# Patient Record
Sex: Female | Born: 1945 | Race: White | Hispanic: No | Marital: Married
Health system: Southern US, Community
[De-identification: ages and names within clinical notes are randomized; demographics above are authoritative.]

---

## 1998-07-11 ENCOUNTER — Other Ambulatory Visit: Admission: RE | Admit: 1998-07-11 | Discharge: 1998-07-11 | Payer: Self-pay | Admitting: Family Medicine

## 2000-09-15 ENCOUNTER — Encounter: Admission: RE | Admit: 2000-09-15 | Discharge: 2000-09-15 | Payer: Self-pay | Admitting: Family Medicine

## 2001-05-14 ENCOUNTER — Inpatient Hospital Stay (HOSPITAL_COMMUNITY): Admission: EM | Admit: 2001-05-14 | Discharge: 2001-05-15 | Payer: Self-pay | Admitting: Emergency Medicine

## 2001-11-28 ENCOUNTER — Encounter: Admission: RE | Admit: 2001-11-28 | Discharge: 2001-11-28 | Payer: Self-pay | Admitting: Family Medicine

## 2001-12-02 ENCOUNTER — Encounter: Admission: RE | Admit: 2001-12-02 | Discharge: 2001-12-02 | Payer: Self-pay | Admitting: Family Medicine

## 2002-01-16 ENCOUNTER — Ambulatory Visit (HOSPITAL_COMMUNITY): Admission: RE | Admit: 2002-01-16 | Discharge: 2002-01-16 | Payer: Self-pay | Admitting: Family Medicine

## 2002-07-03 ENCOUNTER — Ambulatory Visit (HOSPITAL_COMMUNITY): Admission: RE | Admit: 2002-07-03 | Discharge: 2002-07-03 | Payer: Self-pay

## 2004-11-03 ENCOUNTER — Other Ambulatory Visit: Admission: RE | Admit: 2004-11-03 | Discharge: 2004-11-03 | Payer: Self-pay | Admitting: Family Medicine

## 2011-07-07 ENCOUNTER — Ambulatory Visit
Admission: RE | Admit: 2011-07-07 | Discharge: 2011-07-07 | Disposition: A | Discharge status: Home / Self Care | Payer: BC Managed Care – PPO | Source: Ambulatory Visit | Attending: Family Medicine | Admitting: Family Medicine

## 2011-07-07 ENCOUNTER — Other Ambulatory Visit: Payer: Self-pay | Admitting: Family Medicine

## 2011-07-07 DIAGNOSIS — E049 Nontoxic goiter, unspecified: Secondary | ICD-10-CM

## 2012-01-25 ENCOUNTER — Other Ambulatory Visit: Payer: Self-pay | Admitting: Dermatology

## 2013-11-20 ENCOUNTER — Other Ambulatory Visit (HOSPITAL_COMMUNITY)
Admission: RE | Admit: 2013-11-20 | Discharge: 2013-11-20 | Disposition: A | Discharge status: Home / Self Care | Payer: Medicare Other | Source: Ambulatory Visit | Attending: Family Medicine | Admitting: Family Medicine

## 2013-11-20 ENCOUNTER — Other Ambulatory Visit: Payer: Self-pay | Admitting: Family Medicine

## 2013-11-20 DIAGNOSIS — Z124 Encounter for screening for malignant neoplasm of cervix: Secondary | ICD-10-CM | POA: Insufficient documentation

## 2013-11-20 DIAGNOSIS — Z1151 Encounter for screening for human papillomavirus (HPV): Secondary | ICD-10-CM | POA: Insufficient documentation

## 2015-06-03 ENCOUNTER — Other Ambulatory Visit: Payer: Self-pay | Admitting: Family Medicine

## 2015-06-03 DIAGNOSIS — R748 Abnormal levels of other serum enzymes: Secondary | ICD-10-CM

## 2015-06-12 ENCOUNTER — Other Ambulatory Visit: Payer: BC Managed Care – PPO

## 2015-06-13 ENCOUNTER — Ambulatory Visit
Admission: RE | Admit: 2015-06-13 | Discharge: 2015-06-13 | Disposition: A | Discharge status: Home / Self Care | Payer: BC Managed Care – PPO | Source: Ambulatory Visit | Attending: Family Medicine | Admitting: Family Medicine

## 2015-06-13 DIAGNOSIS — R748 Abnormal levels of other serum enzymes: Secondary | ICD-10-CM

## 2015-06-14 ENCOUNTER — Other Ambulatory Visit: Payer: Self-pay | Admitting: Family Medicine

## 2015-06-14 DIAGNOSIS — K769 Liver disease, unspecified: Secondary | ICD-10-CM

## 2015-07-09 ENCOUNTER — Ambulatory Visit
Admission: RE | Admit: 2015-07-09 | Discharge: 2015-07-09 | Disposition: A | Discharge status: Home / Self Care | Payer: Medicare Other | Source: Ambulatory Visit | Attending: Family Medicine | Admitting: Family Medicine

## 2015-07-09 DIAGNOSIS — K769 Liver disease, unspecified: Secondary | ICD-10-CM

## 2015-07-09 MED ORDER — GADOBENATE DIMEGLUMINE 529 MG/ML IV SOLN
14.0000 mL | Freq: Once | INTRAVENOUS | Status: AC | PRN
  Administered 2015-07-09: 14 mL via INTRAVENOUS

## 2017-03-16 IMAGING — MR MR ABDOMEN WO/W CM
17 series · 48 of 48 positions shown · IV contrast (multihance)
Comparison: 06/13/2015

CLINICAL DATA: Elevated liver function tests. Abnormal liver
lesions on ultrasound of 06/13/2015.

EXAM:
MRI ABDOMEN WITHOUT AND WITH CONTRAST
TECHNIQUE: Multiplanar multisequence MR imaging of the abdomen was performed
both before and after the administration of intravenous contrast.
CONTRAST:  14mL MULTIHANCE GADOBENATE DIMEGLUMINE 529 MG/ML IV SOLN
Creatinine was obtained on site at [HOSPITAL] at [HOSPITAL].
Results: Creatinine 0.7 mg/dL.

[Series 4: T2 · coronal · 5.0mm · 1.56mm/px · 1 of 36 slices shown (1 of 3)]
[im 1/36]
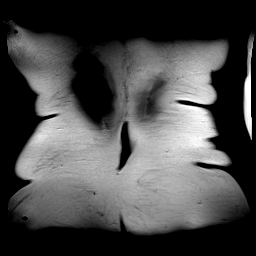

[Series 5: T1 · axial · 3.0mm · 1.19mm/px · z∈[+10,+223]mm · 4 of 144 slices shown]
[im 1/144]
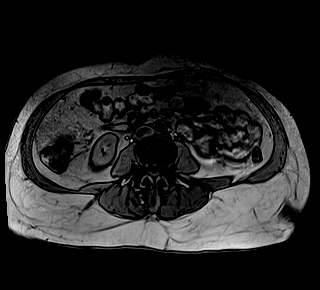
[im 48/144]
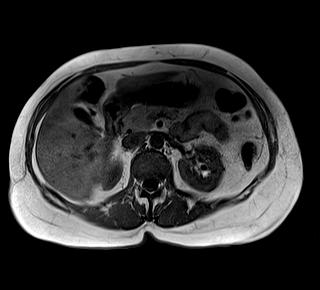
[im 96/144]
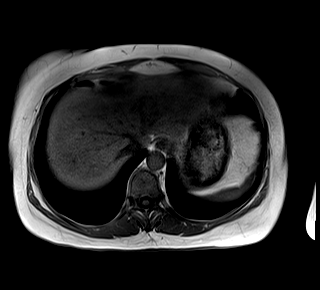
[im 144/144]
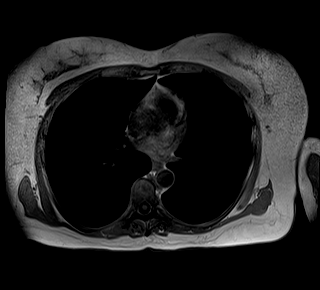

[Series 6: T2 · axial · 5.0mm · 1.48mm/px · 1 of 38 slices shown (2 of 3)]
[im 1/38]
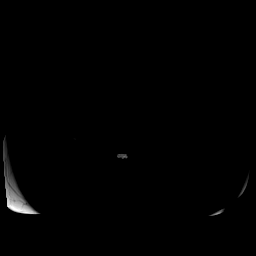

[Series 7: DWI · axial · 5.0mm · 1.42mm/px · z∈[+29,+239]mm · 3 of 108 slices shown (1 of 2)]
[im 1/108]
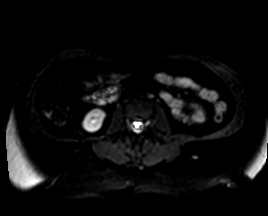
[im 54/108]
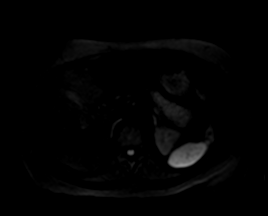
[im 108/108]
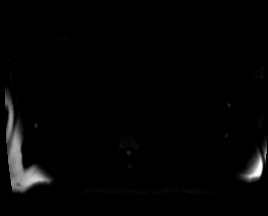

[Series 8: DWI · axial · 5.0mm · 1.42mm/px · 1 of 36 slices shown (2 of 2)]
[im 1/36]
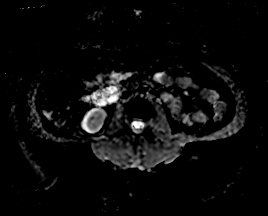

[Series 9: T2 · axial · 6.0mm · 1.22mm/px · 1 of 30 slices shown (3 of 3)]
[im 1/30]
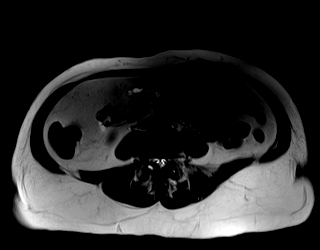

[Series 10: bSSFP · axial · 5.0mm · 1.25mm/px · 1 of 38 slices shown]
[im 1/38]
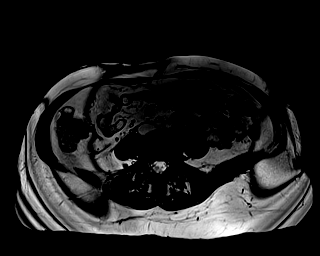

[Series 11: T1 dynamic · axial · non-contrast · 3.0mm · 1.25mm/px · z∈[-8,+205]mm · 2 of 72 slices shown]
[im 1/72]
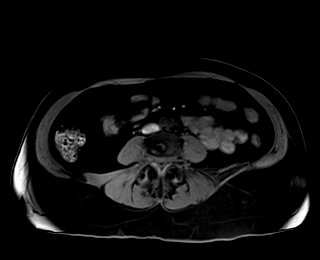
[im 72/72]
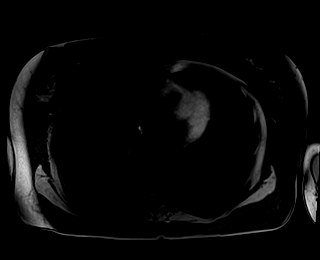

[Series 12: T1 dynamic post-contrast · axial · 3.0mm · 1.25mm/px · z∈[-8,+205]mm · 3 of 72 slices shown (1 of 9)]
[im 1/72]
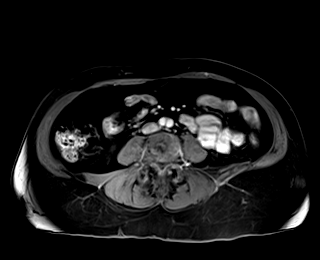
[im 36/72]
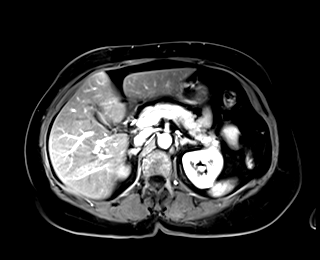
[im 72/72]
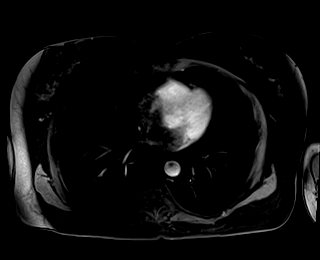

[Series 13: T1 dynamic post-contrast · axial · 3.0mm · 1.25mm/px · z∈[-8,+205]mm · 3 of 72 slices shown (2 of 9)]
[im 1/72]
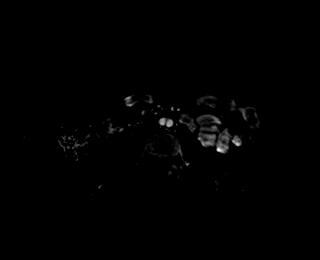
[im 36/72]
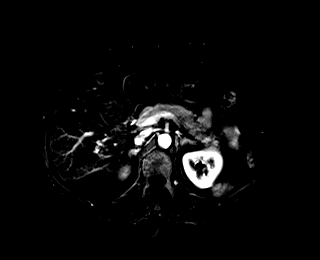
[im 72/72]
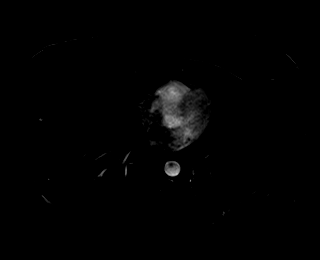

[Series 14: T1 dynamic post-contrast · axial · 3.0mm · 1.25mm/px · z∈[-8,+205]mm · 3 of 72 slices shown (3 of 9)]
[im 1/72]
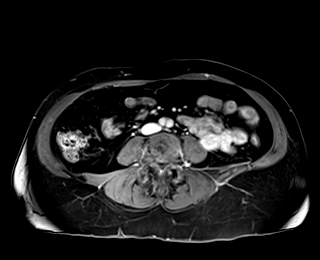
[im 36/72]
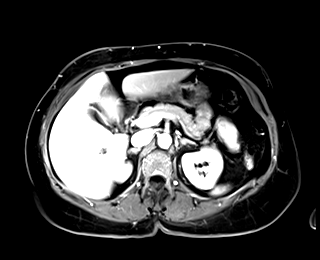
[im 72/72]
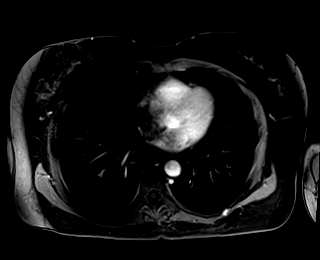

[Series 15: T1 dynamic post-contrast · axial · 3.0mm · 1.25mm/px · z∈[-8,+205]mm · 3 of 72 slices shown (4 of 9)]
[im 1/72]
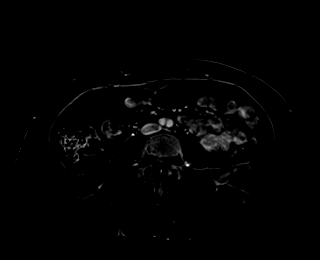
[im 36/72]
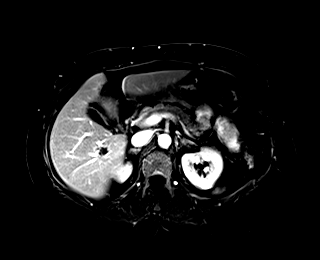
[im 72/72]
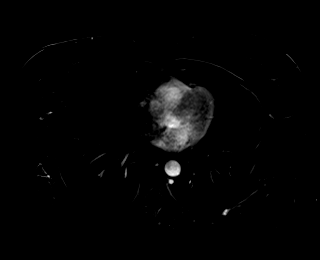

[Series 16: T1 dynamic post-contrast · axial · 3.0mm · 1.25mm/px · z∈[-8,+205]mm · 3 of 72 slices shown (5 of 9)]
[im 1/72]
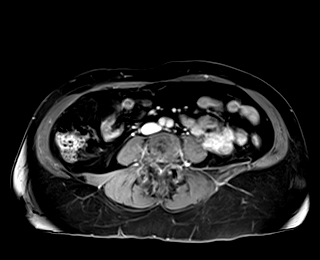
[im 36/72]
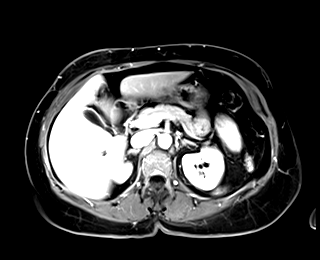
[im 72/72]
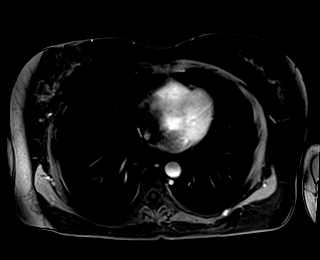

[Series 17: T1 dynamic post-contrast · axial · 3.0mm · 1.25mm/px · z∈[-8,+205]mm · 3 of 72 slices shown (6 of 9)]
[im 1/72]
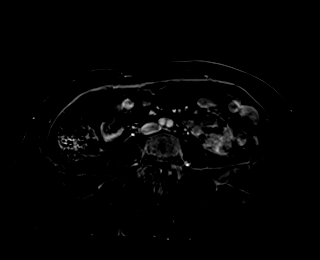
[im 36/72]
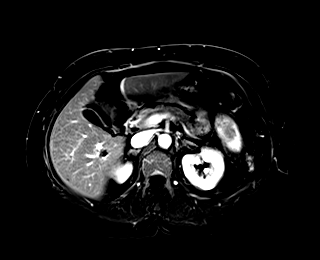
[im 72/72]
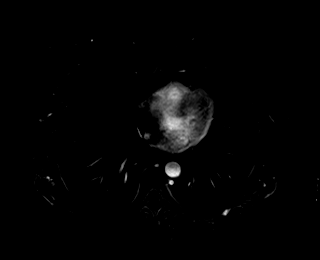

[Series 18: T1 dynamic post-contrast · coronal · 1.5mm · 1.38mm/px · 10 of 288 slices shown (7 of 9)]
[im 1/288]
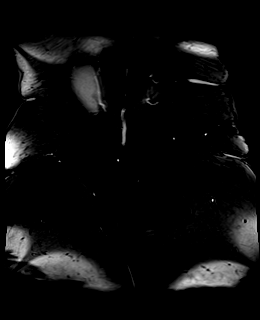
[im 32/288]
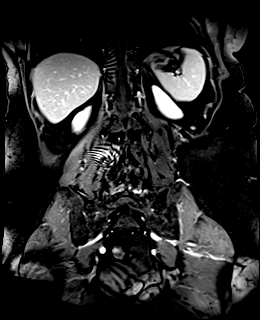
[im 64/288]
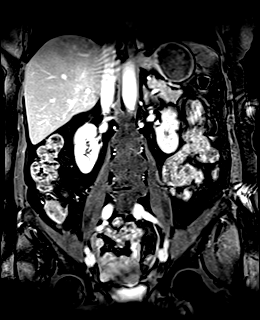
[im 96/288]
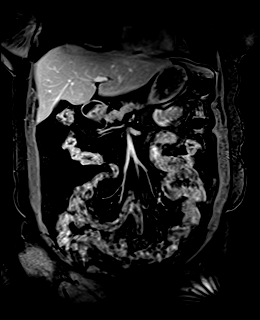
[im 128/288]
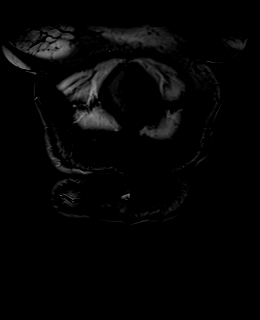
[im 160/288]
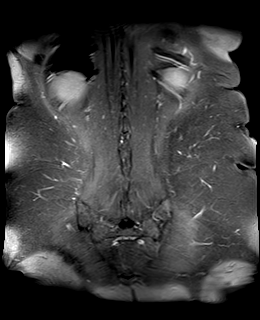
[im 192/288]
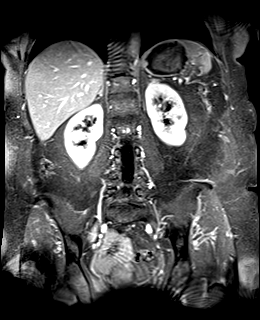
[im 224/288]
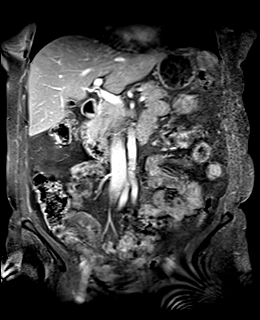
[im 256/288]
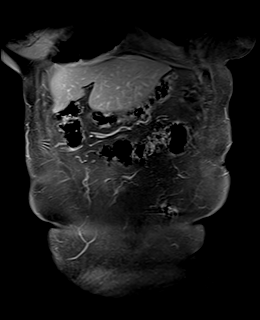
[im 288/288]
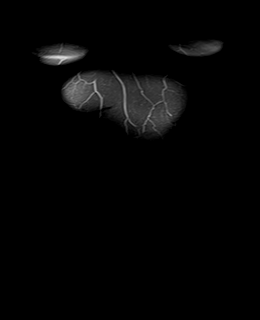

[Series 19: T1 dynamic post-contrast · axial · 3.0mm · 1.25mm/px · z∈[-8,+205]mm · 3 of 72 slices shown (8 of 9)]
[im 1/72]
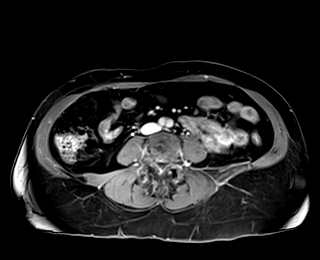
[im 36/72]
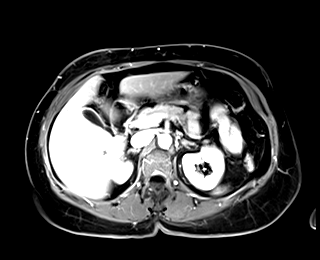
[im 72/72]
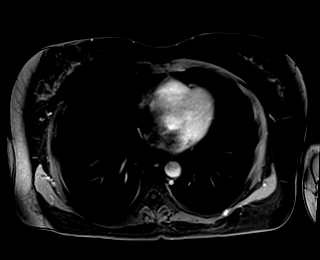

[Series 20: T1 dynamic post-contrast · axial · 3.0mm · 1.25mm/px · z∈[-8,+205]mm · 3 of 72 slices shown (9 of 9)]
[im 1/72]
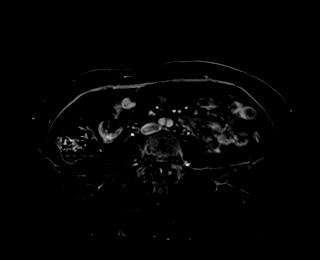
[im 36/72]
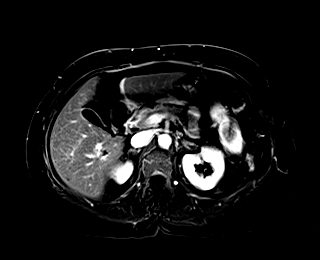
[im 72/72]
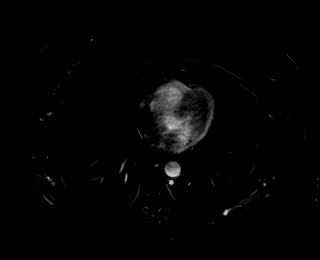

[48 of 48 positions shown; findings below may reference images not displayed]

FINDINGS: Lower chest:  Unremarkable

Hepatobiliary: Focal steatosis in segment 4b of the liver, image 38
series 5.

In segment 6 of the liver, a somewhat bilobed 2.0 by 1.6 cm lesion
has very high T2 signal characteristics and demonstrates
discontinuous nodular progressive centripetal enhancement. This is
not completely filled and by the 3 minutes images but the appearance
is characteristic of a hemangioma.

No other liver lesions are identified. Gallbladder unremarkable. No
biliary dilatation observed.

Pancreas: Unremarkable

Spleen: Unremarkable

Adrenals/Urinary Tract: Unremarkable

Stomach/Bowel: Unremarkable

Vascular/Lymphatic: Unremarkable

Other: No supplemental non-categorized findings.

Musculoskeletal: Degenerative disc disease at L4-5 and L5-S1 with
mild degenerative endplate findings at L4-5.
IMPRESSION: 1. The lesion in segment 4b is felt to represent focal hepatic
steatosis given focal signal dropout on out of phase images in this
vicinity. This appears to correlate with the lesion closest to the
gallbladder fossa on ultrasound.
2. The lesion in segment 6 appears to correlate with the other
sonographic lesion and has enhancement characteristics typical for
benign hemangioma.
3. Lower lumbar degenerative disc disease.

## 2019-08-05 ENCOUNTER — Ambulatory Visit: Payer: Medicare Other | Attending: Internal Medicine

## 2019-08-05 DIAGNOSIS — Z23 Encounter for immunization: Secondary | ICD-10-CM

## 2019-08-05 NOTE — Progress Notes (Signed)
   Covid-19 Vaccination Clinic  Name:  Casey Padilla    MRN: [id number] DOB: January 14, 1946  08/05/2019  Ms. [name] was observed post Covid-19 immunization for 15 minutes without incidence. She was provided with Vaccine Information Sheet and instruction to access the V-Safe system.   Ms. [name] was instructed to call 911 with any severe reactions post vaccine: Marland Kitchen Difficulty breathing  . Swelling of your face and throat  . A fast heartbeat  . A bad rash all over your body  . Dizziness and weakness    Immunizations Administered    Name Date Dose VIS Date Route   Pfizer COVID-19 Vaccine 08/05/2019  8:39 AM 0.3 mL 06/02/2019 Intramuscular   Manufacturer: [employer], [employer]   Lot: [id number]   NDC: [address]-3

## 2019-08-28 ENCOUNTER — Ambulatory Visit: Payer: Medicare PPO | Attending: Internal Medicine

## 2019-08-28 DIAGNOSIS — Z23 Encounter for immunization: Secondary | ICD-10-CM | POA: Insufficient documentation

## 2019-08-28 NOTE — Progress Notes (Signed)
   Covid-19 Vaccination Clinic  Name:  Casey Padilla    MRN: [id number] DOB: Jun 17, 1946  08/28/2019  [name] was observed post Covid-19 immunization for 15 minutes without incident. She was provided with Vaccine Information Sheet and instruction to access the V-Safe system.   [name] was instructed to call 911 with any severe reactions post vaccine: Marland Kitchen Difficulty breathing  . Swelling of face and throat  . A fast heartbeat  . A bad rash all over body  . Dizziness and weakness   Immunizations Administered    Name Date Dose VIS Date Route   Pfizer COVID-19 Vaccine 08/28/2019  8:38 AM 0.3 mL 06/02/2019 Intramuscular   Manufacturer: [employer], [employer]   Lot: [id number]   NDC: [address]-7

## 2020-08-30 DIAGNOSIS — H6121 Impacted cerumen, right ear: Secondary | ICD-10-CM | POA: Diagnosis not present

## 2020-10-31 DIAGNOSIS — M720 Palmar fascial fibromatosis [Dupuytren]: Secondary | ICD-10-CM | POA: Diagnosis not present

## 2020-10-31 DIAGNOSIS — M79641 Pain in right hand: Secondary | ICD-10-CM | POA: Diagnosis not present

## 2020-11-04 DIAGNOSIS — H26493 Other secondary cataract, bilateral: Secondary | ICD-10-CM | POA: Diagnosis not present

## 2020-12-24 DIAGNOSIS — H35372 Puckering of macula, left eye: Secondary | ICD-10-CM | POA: Diagnosis not present

## 2020-12-24 DIAGNOSIS — H18513 Endothelial corneal dystrophy, bilateral: Secondary | ICD-10-CM | POA: Diagnosis not present

## 2020-12-24 DIAGNOSIS — Z961 Presence of intraocular lens: Secondary | ICD-10-CM | POA: Diagnosis not present

## 2020-12-24 DIAGNOSIS — H26493 Other secondary cataract, bilateral: Secondary | ICD-10-CM | POA: Diagnosis not present

## 2020-12-24 DIAGNOSIS — H26491 Other secondary cataract, right eye: Secondary | ICD-10-CM | POA: Diagnosis not present

## 2020-12-24 DIAGNOSIS — H18413 Arcus senilis, bilateral: Secondary | ICD-10-CM | POA: Diagnosis not present

## 2021-01-02 DIAGNOSIS — Z9841 Cataract extraction status, right eye: Secondary | ICD-10-CM | POA: Diagnosis not present

## 2021-01-07 DIAGNOSIS — H26492 Other secondary cataract, left eye: Secondary | ICD-10-CM | POA: Diagnosis not present

## 2021-01-13 DIAGNOSIS — Z961 Presence of intraocular lens: Secondary | ICD-10-CM | POA: Diagnosis not present

## 2021-02-03 DIAGNOSIS — M720 Palmar fascial fibromatosis [Dupuytren]: Secondary | ICD-10-CM | POA: Diagnosis not present

## 2021-02-05 DIAGNOSIS — M25641 Stiffness of right hand, not elsewhere classified: Secondary | ICD-10-CM | POA: Diagnosis not present

## 2021-02-05 DIAGNOSIS — M720 Palmar fascial fibromatosis [Dupuytren]: Secondary | ICD-10-CM | POA: Diagnosis not present

## 2021-02-05 DIAGNOSIS — M79641 Pain in right hand: Secondary | ICD-10-CM | POA: Diagnosis not present

## 2021-02-13 DIAGNOSIS — M25641 Stiffness of right hand, not elsewhere classified: Secondary | ICD-10-CM | POA: Diagnosis not present

## 2021-02-19 DIAGNOSIS — M79641 Pain in right hand: Secondary | ICD-10-CM | POA: Diagnosis not present

## 2021-02-19 DIAGNOSIS — M720 Palmar fascial fibromatosis [Dupuytren]: Secondary | ICD-10-CM | POA: Diagnosis not present

## 2021-05-06 DIAGNOSIS — E782 Mixed hyperlipidemia: Secondary | ICD-10-CM | POA: Diagnosis not present

## 2021-05-06 DIAGNOSIS — R7303 Prediabetes: Secondary | ICD-10-CM | POA: Diagnosis not present

## 2021-05-06 DIAGNOSIS — K219 Gastro-esophageal reflux disease without esophagitis: Secondary | ICD-10-CM | POA: Diagnosis not present

## 2021-05-06 DIAGNOSIS — K760 Fatty (change of) liver, not elsewhere classified: Secondary | ICD-10-CM | POA: Diagnosis not present

## 2021-05-06 DIAGNOSIS — Z0000 Encounter for general adult medical examination without abnormal findings: Secondary | ICD-10-CM | POA: Diagnosis not present

## 2021-05-06 DIAGNOSIS — G4700 Insomnia, unspecified: Secondary | ICD-10-CM | POA: Diagnosis not present

## 2021-05-06 DIAGNOSIS — Z23 Encounter for immunization: Secondary | ICD-10-CM | POA: Diagnosis not present

## 2021-05-06 DIAGNOSIS — M1990 Unspecified osteoarthritis, unspecified site: Secondary | ICD-10-CM | POA: Diagnosis not present

## 2021-05-06 DIAGNOSIS — N951 Menopausal and female climacteric states: Secondary | ICD-10-CM | POA: Diagnosis not present

## 2021-05-06 DIAGNOSIS — I10 Essential (primary) hypertension: Secondary | ICD-10-CM | POA: Diagnosis not present

## 2021-05-26 DIAGNOSIS — G4700 Insomnia, unspecified: Secondary | ICD-10-CM | POA: Diagnosis not present

## 2021-05-26 DIAGNOSIS — R197 Diarrhea, unspecified: Secondary | ICD-10-CM | POA: Diagnosis not present

## 2021-05-26 DIAGNOSIS — F10930 Alcohol use, unspecified with withdrawal, uncomplicated: Secondary | ICD-10-CM | POA: Diagnosis not present

## 2021-06-11 DIAGNOSIS — R350 Frequency of micturition: Secondary | ICD-10-CM | POA: Diagnosis not present

## 2021-06-11 DIAGNOSIS — F419 Anxiety disorder, unspecified: Secondary | ICD-10-CM | POA: Diagnosis not present

## 2021-06-11 DIAGNOSIS — G4700 Insomnia, unspecified: Secondary | ICD-10-CM | POA: Diagnosis not present

## 2021-06-24 DIAGNOSIS — R350 Frequency of micturition: Secondary | ICD-10-CM | POA: Diagnosis not present

## 2021-09-17 DIAGNOSIS — R159 Full incontinence of feces: Secondary | ICD-10-CM | POA: Diagnosis not present

## 2021-09-17 DIAGNOSIS — M79604 Pain in right leg: Secondary | ICD-10-CM | POA: Diagnosis not present

## 2021-10-13 DIAGNOSIS — R531 Weakness: Secondary | ICD-10-CM | POA: Diagnosis not present

## 2021-10-13 DIAGNOSIS — R2689 Other abnormalities of gait and mobility: Secondary | ICD-10-CM | POA: Diagnosis not present

## 2021-10-13 DIAGNOSIS — M25551 Pain in right hip: Secondary | ICD-10-CM | POA: Diagnosis not present

## 2021-10-20 DIAGNOSIS — R531 Weakness: Secondary | ICD-10-CM | POA: Diagnosis not present

## 2021-10-20 DIAGNOSIS — R2689 Other abnormalities of gait and mobility: Secondary | ICD-10-CM | POA: Diagnosis not present

## 2021-10-20 DIAGNOSIS — M25551 Pain in right hip: Secondary | ICD-10-CM | POA: Diagnosis not present

## 2021-10-22 DIAGNOSIS — M25551 Pain in right hip: Secondary | ICD-10-CM | POA: Diagnosis not present

## 2021-10-22 DIAGNOSIS — R2689 Other abnormalities of gait and mobility: Secondary | ICD-10-CM | POA: Diagnosis not present

## 2021-10-22 DIAGNOSIS — R531 Weakness: Secondary | ICD-10-CM | POA: Diagnosis not present

## 2021-12-09 DIAGNOSIS — Z1231 Encounter for screening mammogram for malignant neoplasm of breast: Secondary | ICD-10-CM | POA: Diagnosis not present

## 2021-12-25 DIAGNOSIS — R928 Other abnormal and inconclusive findings on diagnostic imaging of breast: Secondary | ICD-10-CM | POA: Diagnosis not present

## 2021-12-25 DIAGNOSIS — M79604 Pain in right leg: Secondary | ICD-10-CM | POA: Diagnosis not present

## 2021-12-25 DIAGNOSIS — N6325 Unspecified lump in the left breast, overlapping quadrants: Secondary | ICD-10-CM | POA: Diagnosis not present

## 2022-01-07 DIAGNOSIS — M79604 Pain in right leg: Secondary | ICD-10-CM | POA: Diagnosis not present

## 2022-02-09 DIAGNOSIS — K649 Unspecified hemorrhoids: Secondary | ICD-10-CM | POA: Diagnosis not present

## 2022-02-09 DIAGNOSIS — K5730 Diverticulosis of large intestine without perforation or abscess without bleeding: Secondary | ICD-10-CM | POA: Diagnosis not present

## 2022-02-09 DIAGNOSIS — Z1211 Encounter for screening for malignant neoplasm of colon: Secondary | ICD-10-CM | POA: Diagnosis not present

## 2022-06-30 DIAGNOSIS — E782 Mixed hyperlipidemia: Secondary | ICD-10-CM | POA: Diagnosis not present

## 2022-06-30 DIAGNOSIS — G4700 Insomnia, unspecified: Secondary | ICD-10-CM | POA: Diagnosis not present

## 2022-06-30 DIAGNOSIS — Z23 Encounter for immunization: Secondary | ICD-10-CM | POA: Diagnosis not present

## 2022-06-30 DIAGNOSIS — N951 Menopausal and female climacteric states: Secondary | ICD-10-CM | POA: Diagnosis not present

## 2022-06-30 DIAGNOSIS — I10 Essential (primary) hypertension: Secondary | ICD-10-CM | POA: Diagnosis not present

## 2022-10-07 DIAGNOSIS — Z0000 Encounter for general adult medical examination without abnormal findings: Secondary | ICD-10-CM | POA: Diagnosis not present

## 2022-10-07 DIAGNOSIS — Z136 Encounter for screening for cardiovascular disorders: Secondary | ICD-10-CM | POA: Diagnosis not present

## 2022-10-07 DIAGNOSIS — H6121 Impacted cerumen, right ear: Secondary | ICD-10-CM | POA: Diagnosis not present

## 2022-10-07 DIAGNOSIS — R7309 Other abnormal glucose: Secondary | ICD-10-CM | POA: Diagnosis not present

## 2022-10-07 DIAGNOSIS — E782 Mixed hyperlipidemia: Secondary | ICD-10-CM | POA: Diagnosis not present

## 2022-10-07 DIAGNOSIS — I10 Essential (primary) hypertension: Secondary | ICD-10-CM | POA: Diagnosis not present

## 2022-10-07 DIAGNOSIS — K219 Gastro-esophageal reflux disease without esophagitis: Secondary | ICD-10-CM | POA: Diagnosis not present

## 2022-10-07 DIAGNOSIS — N951 Menopausal and female climacteric states: Secondary | ICD-10-CM | POA: Diagnosis not present

## 2022-10-07 DIAGNOSIS — K760 Fatty (change of) liver, not elsewhere classified: Secondary | ICD-10-CM | POA: Diagnosis not present

## 2022-10-07 DIAGNOSIS — R7303 Prediabetes: Secondary | ICD-10-CM | POA: Diagnosis not present

## 2022-12-15 DIAGNOSIS — Z1231 Encounter for screening mammogram for malignant neoplasm of breast: Secondary | ICD-10-CM | POA: Diagnosis not present

## 2023-01-06 DIAGNOSIS — R922 Inconclusive mammogram: Secondary | ICD-10-CM | POA: Diagnosis not present

## 2023-07-13 DIAGNOSIS — D492 Neoplasm of unspecified behavior of bone, soft tissue, and skin: Secondary | ICD-10-CM | POA: Diagnosis not present

## 2023-07-13 DIAGNOSIS — L111 Transient acantholytic dermatosis [Grover]: Secondary | ICD-10-CM | POA: Diagnosis not present

## 2023-07-13 DIAGNOSIS — L821 Other seborrheic keratosis: Secondary | ICD-10-CM | POA: Diagnosis not present

## 2023-07-13 DIAGNOSIS — L814 Other melanin hyperpigmentation: Secondary | ICD-10-CM | POA: Diagnosis not present

## 2023-07-13 DIAGNOSIS — D225 Melanocytic nevi of trunk: Secondary | ICD-10-CM | POA: Diagnosis not present

## 2023-09-06 DIAGNOSIS — Z01818 Encounter for other preprocedural examination: Secondary | ICD-10-CM | POA: Diagnosis not present

## 2023-09-06 DIAGNOSIS — D485 Neoplasm of uncertain behavior of skin: Secondary | ICD-10-CM | POA: Diagnosis not present

## 2023-09-06 DIAGNOSIS — R234 Changes in skin texture: Secondary | ICD-10-CM | POA: Diagnosis not present

## 2023-09-06 DIAGNOSIS — H0279 Other degenerative disorders of eyelid and periocular area: Secondary | ICD-10-CM | POA: Diagnosis not present

## 2023-11-02 DIAGNOSIS — D485 Neoplasm of uncertain behavior of skin: Secondary | ICD-10-CM | POA: Diagnosis not present

## 2023-11-02 DIAGNOSIS — H0279 Other degenerative disorders of eyelid and periocular area: Secondary | ICD-10-CM | POA: Diagnosis not present

## 2023-11-02 DIAGNOSIS — L820 Inflamed seborrheic keratosis: Secondary | ICD-10-CM | POA: Diagnosis not present

## 2023-11-02 DIAGNOSIS — R234 Changes in skin texture: Secondary | ICD-10-CM | POA: Diagnosis not present

## 2023-12-21 DIAGNOSIS — Z1231 Encounter for screening mammogram for malignant neoplasm of breast: Secondary | ICD-10-CM | POA: Diagnosis not present
